# Patient Record
Sex: Male | Born: 1995
Health system: Southern US, Community
[De-identification: ages and names within clinical notes are randomized; demographics above are authoritative.]

## PROBLEM LIST (undated history)

## (undated) DIAGNOSIS — Z8781 Personal history of (healed) traumatic fracture: Secondary | ICD-10-CM

---
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
## 2018-01-22 DIAGNOSIS — Z23 Encounter for immunization: Secondary | ICD-10-CM | POA: Diagnosis not present

## 2018-06-22 ENCOUNTER — Ambulatory Visit: Payer: 59 | Admitting: Family Medicine

## 2018-06-22 ENCOUNTER — Encounter: Payer: Self-pay | Admitting: Family Medicine

## 2018-06-22 VITALS — BP 108/76 | HR 76 | Temp 97.7°F | Ht 68.0 in | Wt 139.0 lb

## 2018-06-22 DIAGNOSIS — Z0000 Encounter for general adult medical examination without abnormal findings: Secondary | ICD-10-CM

## 2018-06-22 DIAGNOSIS — Z0001 Encounter for general adult medical examination with abnormal findings: Secondary | ICD-10-CM

## 2018-06-22 DIAGNOSIS — H6502 Acute serous otitis media, left ear: Secondary | ICD-10-CM | POA: Diagnosis not present

## 2018-06-22 MED ORDER — AMOXICILLIN 500 MG PO CAPS: 500.0000 mg | ORAL_CAPSULE | Freq: Two times a day (BID) | ORAL | 0 refills | Status: AC

## 2018-06-22 NOTE — Progress Notes (Signed)
Subjective:     Antonio Roberts is a 22 y.o. male and is here for a comprehensive physical exam. In the past was seen by Dr. Yehuda Budd.  The patient reports problems - L ear feels "stopped up".  Pt notes he feels like his ear never "popped" since returning from a flight to the [place] at the end of June. Pt did have cold symptoms for a few days after his trip.  Pt denies fever, chills, ear or facial pain or pressure.   Pt is working at [employer].  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  Pt denies tobacco, EtOH, or drug use.  Pt occasionally vapes.  Allergies: NKDA  Pt requesting any immunizations he needs for school.   Social History   Socioeconomic History  . Marital status: Single    Spouse name: Not on file  . Number of children: Not on file  . Years of education: Not on file  . Highest education level: Not on file  Occupational History  . Not on file  Social Needs  . Financial resource strain: Not on file  . Food insecurity:    Worry: Not on file    Inability: Not on file  . Transportation needs:    Medical: Not on file    Non-medical: Not on file  Tobacco Use  . Smoking status: Never Smoker  . Smokeless tobacco: Never Used  Substance and Sexual Activity  . Alcohol use: Yes    Comment: occassional  . Drug use: Never  . Sexual activity: Not on file  Lifestyle  . Physical activity:    Days per week: Not on file    Minutes per session: Not on file  . Stress: Not on file  Relationships  . Social connections:    Talks on phone: Not on file    Gets together: Not on file    Attends religious service: Not on file    Active member of club or organization: Not on file    Attends meetings of clubs or organizations: Not on file    Relationship status: Not on file  . Intimate partner violence:    Fear of current or ex partner: Not on file    Emotionally abused: Not on file    Physically abused: Not on file     Forced sexual activity: Not on file  Other Topics Concern  . Not on file  Social History Narrative  . Not on file   Health Maintenance  Topic Date Due  . HIV Screening  08/24/2013  . TETANUS/TDAP  08/24/2017  . INFLUENZA VACCINE  07/16/2018    The following portions of the patient's history were reviewed and updated as appropriate: allergies, current medications, past family history, past medical history, past social history, past surgical history and problem list.  Review of Systems A comprehensive review of systems was negative.   Objective:    BP 108/76 (BP Location: Left Arm, Patient Position: Sitting, Cuff Size: Normal)   Pulse 76   Temp 97.7 F (36.5 C) (Oral)   Ht 5\' 8"  (1.727 m)   Wt 139 lb (63 kg)   SpO2 98%   BMI 21.13 kg/m  General appearance: alert, cooperative and no distress Head: Normocephalic, without obvious abnormality, atraumatic Eyes: conjunctivae/corneas clear. PERRL, EOM's intact. Fundi benign. Ears: normal R TM and external ear canal.  L external ear normal.  L TM erythematous, full, small area of purulence. Nose:  Nares normal. Septum midline. Mucosa normal. No drainage or sinus tenderness. Throat: lips, mucosa, and tongue normal; teeth and gums normal Neck: no adenopathy, no carotid bruit, no JVD, supple, symmetrical, trachea midline and thyroid not enlarged, symmetric, no tenderness/mass/nodules Lungs: clear to auscultation bilaterally Heart: regular rate and rhythm, S1, S2 normal, no murmur, click, rub or gallop Abdomen: soft, non-tender; bowel sounds normal; no masses,  no organomegaly Extremities: extremities normal, atraumatic, no cyanosis or edema Skin: Skin color, texture, turgor normal. No rashes or lesions Neurologic: Alert and oriented X 3, normal strength and tone. Normal symmetric reflexes. Normal coordination and gait    Assessment:    Healthy male exam with AOM of L ear.     Plan:     Anticipatory guidance given including  wearing seatbelts, smoke detectors in the home, increasing physical activity, increasing p.o. intake of water and vegetables. -next CPE in 1 yr -will check [facility] to see if pt needs any vaccines for school.    No vaccine hx in [facility].  Pt is unsure about immunizations.  Will have him check with his parents for immunization record.  Pt can schedule a nurse visit to review immunization record and provide any missing vaccines. See After Visit Summary for Counseling Recommendations    AOM, L -amoxicillin 500 mg BID x 7 days -given handout  F/u prn  Abbe AmsterdamShannon Cadel Stairs, MD

## 2018-06-22 NOTE — Patient Instructions (Signed)
Preventive Care 18-39 Years, Male Preventive care refers to lifestyle choices and visits with your health care provider that can promote health and wellness. What does preventive care include?  A yearly physical exam. This is also called an annual well check.  Dental exams once or twice a year.  Routine eye exams. Ask your health care provider how often you should have your eyes checked.  Personal lifestyle choices, including: ? Daily care of your teeth and gums. ? Regular physical activity. ? Eating a healthy diet. ? Avoiding tobacco and drug use. ? Limiting alcohol use. ? Practicing safe sex. What happens during an annual well check? The services and screenings done by your health care provider during your annual well check will depend on your age, overall health, lifestyle risk factors, and family history of disease. Counseling Your health care provider may ask you questions about your:  Alcohol use.  Tobacco use.  Drug use.  Emotional well-being.  Home and relationship well-being.  Sexual activity.  Eating habits.  Work and work Statistician.  Screening You may have the following tests or measurements:  Height, weight, and BMI.  Blood pressure.  Lipid and cholesterol levels. These may be checked every 5 years starting at age 29.  Diabetes screening. This is done by checking your blood sugar (glucose) after you have not eaten for a while (fasting).  Skin check.  Hepatitis C blood test.  Hepatitis B blood test.  Sexually transmitted disease (STD) testing.  Discuss your test results, treatment options, and if necessary, the need for more tests with your health care provider. Vaccines Your health care provider may recommend certain vaccines, such as:  Influenza vaccine. This is recommended every year.  Tetanus, diphtheria, and acellular pertussis (Tdap, Td) vaccine. You may need a Td booster every 10 years.  Varicella vaccine. You may need this if you  have not been vaccinated.  HPV vaccine. If you are 18 or younger, you may need three doses over 6 months.  Measles, mumps, and rubella (MMR) vaccine. You may need at least one dose of MMR.You may also need a second dose.  Pneumococcal 13-valent conjugate (PCV13) vaccine. You may need this if you have certain conditions and have not been vaccinated.  Pneumococcal polysaccharide (PPSV23) vaccine. You may need one or two doses if you smoke cigarettes or if you have certain conditions.  Meningococcal vaccine. One dose is recommended if you are age 55-21 years and a first-year college student living in a residence hall, or if you have one of several medical conditions. You may also need additional booster doses.  Hepatitis A vaccine. You may need this if you have certain conditions or if you travel or work in places where you may be exposed to hepatitis A.  Hepatitis B vaccine. You may need this if you have certain conditions or if you travel or work in places where you may be exposed to hepatitis B.  Haemophilus influenzae type b (Hib) vaccine. You may need this if you have certain risk factors.  Talk to your health care provider about which screenings and vaccines you need and how often you need them. This information is not intended to replace advice given to you by your health care provider. Make sure you discuss any questions you have with your health care provider. Document Released: 01/28/2002 Document Revised: 08/21/2016 Document Reviewed: 10/03/2015 Elsevier Interactive Patient Education  2018 [employer].  Otitis Media, Adult Otitis media occurs when there is inflammation and fluid in the  middle ear. Your middle ear is a part of the ear that contains bones for hearing as well as air that helps send sounds to your brain. What are the causes? This condition is caused by a blockage in the eustachian tube. This tube drains fluid from the ear to the back of the nose (nasopharynx). A  blockage in this tube can be caused by an object or by swelling (edema) in the tube. Problems that can cause a blockage include:  A cold or other upper respiratory infection.  Allergies.  An irritant, such as tobacco smoke.  Enlarged adenoids. The adenoids are areas of soft tissue located high in the back of the throat, behind the nose and the roof of the mouth.  A mass in the nasopharynx.  Damage to the ear caused by pressure changes (barotrauma).  What are the signs or symptoms? Symptoms of this condition include:  Ear pain.  A fever.  Decreased hearing.  A headache.  Tiredness (lethargy).  Fluid leaking from the ear.  Ringing in the ear.  How is this diagnosed? This condition is diagnosed with a physical exam. During the exam your health care provider will use an instrument called an otoscope to look into your ear and check for redness, swelling, and fluid. He or she will also ask about your symptoms. Your health care provider may also order tests, such as:  A test to check the movement of the eardrum (pneumatic otoscopy). This test is done by squeezing a small amount of air into the ear.  A test that changes air pressure in the middle ear to check how well the eardrum moves and whether the eustachian tube is working (tympanogram).  How is this treated? This condition usually goes away on its own within 3-5 days. But if the condition is caused by a bacteria infection and does not go away own its own, or keeps coming back, your health care provider may:  Prescribe antibiotic medicines to treat the infection.  Prescribe or recommend medicines to control pain.  Follow these instructions at home:  Take over-the-counter and prescription medicines only as told by your health care provider.  If you were prescribed an antibiotic medicine, take it as told by your health care provider. Do not stop taking the antibiotic even if you start to feel better.  Keep all follow-up  visits as told by your health care provider. This is important. Contact a health care provider if:  You have bleeding from your nose.  There is a lump on your neck.  You are not getting better in 5 days.  You feel worse instead of better. Get help right away if:  You have severe pain that is not controlled with medicine.  You have swelling, redness, or pain around your ear.  You have stiffness in your neck.  A part of your face is paralyzed.  The bone behind your ear (mastoid) is tender when you touch it.  You develop a severe headache. Summary  Otitis media is redness, soreness, and swelling of the middle ear.  This condition usually goes away on its own within 3-5 days.  If the problem does not go away in 3-5 days, your health care provider may prescribe or recommend medicines to treat your symptoms.  If you were prescribed an antibiotic medicine, take it as told by your health care provider. This information is not intended to replace advice given to you by your health care provider. Make sure you discuss any questions  you have with your health care provider. Document Released: 09/06/2004 Document Revised: 11/22/2016 Document Reviewed: 11/22/2016 Elsevier Interactive Patient Education  [employer].

## 2018-06-26 ENCOUNTER — Ambulatory Visit: Payer: 59

## 2018-06-26 ENCOUNTER — Ambulatory Visit (INDEPENDENT_AMBULATORY_CARE_PROVIDER_SITE_OTHER): Payer: 59

## 2018-06-26 DIAGNOSIS — Z23 Encounter for immunization: Secondary | ICD-10-CM

## 2018-09-22 DIAGNOSIS — Z23 Encounter for immunization: Secondary | ICD-10-CM | POA: Diagnosis not present

## 2022-07-02 DIAGNOSIS — J0100 Acute maxillary sinusitis, unspecified: Secondary | ICD-10-CM | POA: Diagnosis not present

## 2022-07-02 DIAGNOSIS — H6121 Impacted cerumen, right ear: Secondary | ICD-10-CM | POA: Diagnosis not present

## 2022-07-02 DIAGNOSIS — H66001 Acute suppurative otitis media without spontaneous rupture of ear drum, right ear: Secondary | ICD-10-CM | POA: Diagnosis not present

## 2022-07-02 DIAGNOSIS — Z6824 Body mass index (BMI) 24.0-24.9, adult: Secondary | ICD-10-CM | POA: Diagnosis not present

## 2022-09-02 DIAGNOSIS — J029 Acute pharyngitis, unspecified: Secondary | ICD-10-CM | POA: Diagnosis not present

## 2022-09-02 DIAGNOSIS — Z6824 Body mass index (BMI) 24.0-24.9, adult: Secondary | ICD-10-CM | POA: Diagnosis not present

## 2022-09-02 DIAGNOSIS — H6983 Other specified disorders of Eustachian tube, bilateral: Secondary | ICD-10-CM | POA: Diagnosis not present

## 2023-01-18 DIAGNOSIS — F419 Anxiety disorder, unspecified: Secondary | ICD-10-CM | POA: Diagnosis not present

## 2023-01-24 DIAGNOSIS — F419 Anxiety disorder, unspecified: Secondary | ICD-10-CM | POA: Diagnosis not present

## 2023-02-04 DIAGNOSIS — F331 Major depressive disorder, recurrent, moderate: Secondary | ICD-10-CM | POA: Diagnosis not present

## 2023-02-04 DIAGNOSIS — F988 Other specified behavioral and emotional disorders with onset usually occurring in childhood and adolescence: Secondary | ICD-10-CM | POA: Diagnosis not present

## 2023-02-04 DIAGNOSIS — F411 Generalized anxiety disorder: Secondary | ICD-10-CM | POA: Diagnosis not present

## 2023-02-04 DIAGNOSIS — G4700 Insomnia, unspecified: Secondary | ICD-10-CM | POA: Diagnosis not present

## 2023-02-04 DIAGNOSIS — R5383 Other fatigue: Secondary | ICD-10-CM | POA: Diagnosis not present

## 2023-02-21 DIAGNOSIS — F419 Anxiety disorder, unspecified: Secondary | ICD-10-CM | POA: Diagnosis not present

## 2023-03-12 DIAGNOSIS — F331 Major depressive disorder, recurrent, moderate: Secondary | ICD-10-CM | POA: Diagnosis not present

## 2023-03-12 DIAGNOSIS — F411 Generalized anxiety disorder: Secondary | ICD-10-CM | POA: Diagnosis not present

## 2023-03-12 DIAGNOSIS — G4700 Insomnia, unspecified: Secondary | ICD-10-CM | POA: Diagnosis not present

## 2023-03-14 DIAGNOSIS — F419 Anxiety disorder, unspecified: Secondary | ICD-10-CM | POA: Diagnosis not present

## 2023-04-16 DIAGNOSIS — F419 Anxiety disorder, unspecified: Secondary | ICD-10-CM | POA: Diagnosis not present

## 2023-06-06 DIAGNOSIS — F419 Anxiety disorder, unspecified: Secondary | ICD-10-CM | POA: Diagnosis not present

## 2023-08-13 DIAGNOSIS — F411 Generalized anxiety disorder: Secondary | ICD-10-CM | POA: Diagnosis not present

## 2023-08-13 DIAGNOSIS — G4700 Insomnia, unspecified: Secondary | ICD-10-CM | POA: Diagnosis not present

## 2023-08-13 DIAGNOSIS — F331 Major depressive disorder, recurrent, moderate: Secondary | ICD-10-CM | POA: Diagnosis not present

## 2023-08-27 ENCOUNTER — Encounter: Payer: Self-pay | Admitting: Family Medicine

## 2023-08-27 ENCOUNTER — Ambulatory Visit (INDEPENDENT_AMBULATORY_CARE_PROVIDER_SITE_OTHER): Payer: BC Managed Care – PPO | Admitting: Family Medicine

## 2023-08-27 VITALS — BP 120/90 | HR 88 | Ht 68.0 in | Wt 166.0 lb

## 2023-08-27 DIAGNOSIS — M9901 Segmental and somatic dysfunction of cervical region: Secondary | ICD-10-CM

## 2023-08-27 DIAGNOSIS — M542 Cervicalgia: Secondary | ICD-10-CM

## 2023-08-27 DIAGNOSIS — M9902 Segmental and somatic dysfunction of thoracic region: Secondary | ICD-10-CM

## 2023-08-27 DIAGNOSIS — M546 Pain in thoracic spine: Secondary | ICD-10-CM | POA: Diagnosis not present

## 2023-08-27 DIAGNOSIS — M9908 Segmental and somatic dysfunction of rib cage: Secondary | ICD-10-CM | POA: Diagnosis not present

## 2023-08-27 DIAGNOSIS — G2589 Other specified extrapyramidal and movement disorders: Secondary | ICD-10-CM

## 2023-08-27 NOTE — Assessment & Plan Note (Signed)
   Decision today to treat with OMT was based on Physical Exam  After verbal consent patient was treated with HVLA, ME, FPR techniques in cervical, thoracic, rib,areas, all areas are chronic   Patient tolerated the procedure well with improvement in symptoms  Patient given exercises, stretches and lifestyle modifications  See medications in patient instructions if given  Patient will follow up in 4-8 weeks 

## 2023-08-27 NOTE — Assessment & Plan Note (Signed)
Family patient has more of a scapular dyskinesis secondary to posture and ergonomics.  Discussed which activities to do and which ones to avoid.  Discussed home exercises and icing regimen.  Discussed increasing activity otherwise.  Follow-up again in 6 to 8 weeks.

## 2023-08-27 NOTE — Progress Notes (Signed)
  [facility] [address] [place] [address] Phone: [phone] Subjective:   Antonio Roberts, am serving as a scribe for Dr. Antoine Primas.  I'm seeing this patient by the request  of:  Lewis Moccasin, MD  CC: Upper Back pain  [id number]  Antonio Roberts is a 27 y.o. male coming in with complaint of upper back pain. Patient states that he woke up last week and he had a back spasm in thoracic spine. Pain has improved but is still achy in the scapular area. Area can be numb and tingling at times. Pain with skull crushers. Denies any radiaing symptoms.       Past Medical History:  Diagnosis Date   Hx of fracture of arm    No past surgical history on file. Social History   Socioeconomic History   Marital status: Single    Spouse name: Not on file   Number of children: Not on file   Years of education: Not on file   Highest education level: Not on file  Occupational History   Not on file  Tobacco Use   Smoking status: Never   Smokeless tobacco: Never  Substance and Sexual Activity   Alcohol use: Yes    Comment: occassional   Drug use: Never   Sexual activity: Not on file  Other Topics Concern   Not on file  Social History Narrative   Not on file   Social Determinants of Health   Financial Resource Strain: Not on file  Food Insecurity: Not on file  Transportation Needs: Not on file  Physical Activity: Not on file  Stress: Not on file  Social Connections: Not on file   No Known Allergies Family History  Problem Relation Age of Onset   Hyperlipidemia Father    Hypertension Father    Depression Brother          Current Outpatient Medications (Other):    buPROPion (WELLBUTRIN XL) 150 MG 24 hr tablet, Take 150 mg by mouth daily.   Reviewed prior external information including notes and imaging from  primary care provider As well as notes that were available from care everywhere and other healthcare  systems.  Past medical history, social, surgical and family history all reviewed in electronic medical record.  No pertanent information unless stated regarding to the chief complaint.   Review of Systems:  No headache, visual changes, nausea, vomiting, diarrhea, constipation, dizziness, abdominal pain, skin rash, fevers, chills, night sweats, weight loss, swollen lymph nodes, body aches, joint swelling, chest pain, shortness of breath, mood changes. POSITIVE muscle aches  Objective  Blood pressure (!) 120/90, pulse 88, height 5\' 8"  (1.727 m), weight 166 lb (75.3 kg), SpO2 99%.   General: No apparent distress alert and oriented x3 mood and affect normal, dressed appropriately.  HEENT: Pupils equal, extraocular movements intact  Respiratory: Patient's speak in full sentences and does not appear short of breath  Cardiovascular: No lower extremity edema, non tender, no erythema  Patient does have scapular dyskinesis noted on the right side.  The patient does have some inferior winging noted.  Patient does have some tightness noted in the paraspinal musculature otherwise.  Osteopathic findings C2 flexed rotated and side bent right T7 extended rotated and side bent right inhaled rib  Impression and Recommendations:    The above documentation has been reviewed and is accurate and complete Judi Saa, DO

## 2023-08-27 NOTE — Patient Instructions (Signed)
Do prescribed exercises at least 3x a week  Adjustable standing desk Consider a vertical mouse Stay active See you again in 6-8 weeks

## 2023-09-04 ENCOUNTER — Ambulatory Visit: Payer: Self-pay | Admitting: Family Medicine

## 2023-09-26 DIAGNOSIS — F419 Anxiety disorder, unspecified: Secondary | ICD-10-CM | POA: Diagnosis not present

## 2023-10-08 ENCOUNTER — Ambulatory Visit: Payer: BC Managed Care – PPO | Admitting: Family Medicine

## 2023-10-12 ENCOUNTER — Other Ambulatory Visit: Payer: Self-pay

## 2023-10-12 ENCOUNTER — Encounter (HOSPITAL_COMMUNITY): Payer: Self-pay

## 2023-10-12 ENCOUNTER — Ambulatory Visit (HOSPITAL_COMMUNITY)
Admission: RE | Admit: 2023-10-12 | Discharge: 2023-10-12 | Disposition: A | Discharge status: Home / Self Care | Payer: BC Managed Care – PPO | Source: Ambulatory Visit | Attending: Family Medicine | Admitting: Family Medicine

## 2023-10-12 VITALS — BP 132/75 | HR 92 | Temp 97.9°F | Resp 20

## 2023-10-12 DIAGNOSIS — K4030 Unilateral inguinal hernia, with obstruction, without gangrene, not specified as recurrent: Secondary | ICD-10-CM

## 2023-10-12 NOTE — ED Provider Notes (Signed)
[facility]    CSN: [id number] Arrival date & time: 10/12/23  1440      History   Chief Complaint Chief Complaint  Patient presents with   Testicle Pain    Swollen lymph node in pelvic/groin region. Not exactly testicle pain but I don't know what else to put it as - Entered by patient   Groin Pain    HPI Antonio Roberts is a 27 y.o. male.   Patient presents to clinic for complaints of left-sided groin swelling that started on Thursday.  The night before he had went out to eat and had gastrointestinal upset, reports multiple bowel movements.  He woke up in the morning with left-sided inguinal swelling.  This is never happened before.  He denies any fevers, no nausea or vomiting.  Reports the pain was worse on Thursday and has had improved over the past few days but the bulge persists.  Initially he was able to push the bulge back in, cannot push it back and now.  Reports the area is nontender to palpation.  Denies dysuria.  No ingrown hairs, drainage or concern for abscess.  The history is provided by the patient and medical records.  Testicle Pain  Groin Pain    Past Medical History:  Diagnosis Date   Hx of fracture of arm     Patient Active Problem List   Diagnosis Date Noted   Scapular dyskinesis 08/27/2023   Somatic dysfunction of spine, thoracic 08/27/2023    History reviewed. No pertinent surgical history.     Home Medications    Prior to Admission medications   Medication Sig Start Date End Date Taking? Authorizing Provider  buPROPion (WELLBUTRIN XL) 150 MG 24 hr tablet Take 150 mg by mouth daily.   Yes [provider]    Family History Family History  Problem Relation Age of Onset   Hyperlipidemia Father    Hypertension Father    Depression Brother     Social History Social History   Tobacco Use   Smoking status: Never   Smokeless tobacco: Never  Substance Use Topics   Alcohol use: Yes    Comment: occassional    Drug use: Never     Allergies   Patient has no known allergies.   Review of Systems Review of Systems  Per HPI   Physical Exam Triage Vital Signs ED Triage Vitals  Encounter Vitals Group     BP 10/12/23 1459 132/75     Systolic BP Percentile --      Diastolic BP Percentile --      Pulse Rate 10/12/23 1459 92     Resp 10/12/23 1459 20     Temp 10/12/23 1459 97.9 F (36.6 C)     Temp src --      SpO2 10/12/23 1459 97 %     Weight --      Height --      Head Circumference --      Peak Flow --      Pain Score 10/12/23 1458 6     Pain Loc --      Pain Education --      Exclude from Growth Chart --    No data found.  Updated Vital Signs BP 132/75   Pulse 92   Temp 97.9 F (36.6 C)   Resp 20   SpO2 97%   Visual Acuity Right Eye Distance:   Left Eye Distance:   Bilateral Distance:  Right Eye Near:   Left Eye Near:    Bilateral Near:     Physical Exam Vitals and nursing note reviewed. Exam conducted with a chaperone present.  Constitutional:      Appearance: Normal appearance.  HENT:     Head: Normocephalic and atraumatic.     Right Ear: External ear normal.     Left Ear: External ear normal.     Nose: Nose normal.  Cardiovascular:     Rate and Rhythm: Normal rate.  Pulmonary:     Effort: Pulmonary effort is normal.  Abdominal:     Hernia: A hernia is present. Hernia is present in the left inguinal area.  Genitourinary:      Comments: Large irreducible hernia to the left inguinal canal.  Area is nontender to palpation. Musculoskeletal:        General: Normal range of motion.     Cervical back: Normal range of motion.  Skin:    General: Skin is warm and dry.  Neurological:     General: No focal deficit present.     Mental Status: He is alert and oriented to person, place, and time.  Psychiatric:        Mood and Affect: Mood normal.        Behavior: Behavior normal. Behavior is cooperative.      UC Treatments / Results  Labs (all labs  ordered are listed, but only abnormal results are displayed) Labs Reviewed - No data to display  EKG   Radiology No results found.  Procedures Procedures (including critical care time)  Medications Ordered in UC Medications - No data to display  Initial Impression / Assessment and Plan / UC Course  I have reviewed the triage vital signs and the nursing notes.  Pertinent labs & imaging results that were available during my care of the patient were reviewed by me and considered in my medical decision making (see chart for details).  Vitals and triage reviewed, patient is hemodynamically stable.  Chaperone present on physical exam, appears to have a large reducible inguinal canal hernia left side.  Does not have any current signs or symptoms of strangulation, no pain, fever, nausea or vomiting.  Advised strict follow-up with [facility] in the emergency department if symptoms progress.  Plan of care, follow-up care and return precautions given, no questions at this time.     Final Clinical Impressions(s) / UC Diagnoses   Final diagnoses:  Inguinal hernia with irreducibility     Discharge Instructions      You appear to have a left-sided inguinal hernia.  Please do not strain with bowel movements or pick up anything heavy at this time.  You can ice the area and take ibuprofen as needed for pain, 800 mg every 8 hours with food.  Please call [facility] tomorrow for further evaluation.  In the meantime do not hesitate to seek immediate care at the nearest emergency department if you develop nausea, vomiting, extreme pain, or any worsening symptoms.      ED Prescriptions   None    PDMP not reviewed this encounter.   Kayra Crowell, [place], [place] 10/12/23 [phone]

## 2023-10-12 NOTE — Discharge Instructions (Signed)
You appear to have a left-sided inguinal hernia.  Please do not strain with bowel movements or pick up anything heavy at this time.  You can ice the area and take ibuprofen as needed for pain, 800 mg every 8 hours with food.  Please call [facility] tomorrow for further evaluation.  In the meantime do not hesitate to seek immediate care at the nearest emergency department if you develop nausea, vomiting, extreme pain, or any worsening symptoms.

## 2023-10-12 NOTE — ED Triage Notes (Signed)
Pt reports an enlarged lymph node in Lt groin since Thursday.

## 2023-10-13 ENCOUNTER — Observation Stay (HOSPITAL_COMMUNITY)
Admission: EM | Admit: 2023-10-13 | Discharge: 2023-10-14 | Disposition: A | Discharge status: Home / Self Care | Payer: Worker's Compensation | Attending: Surgery | Admitting: Surgery

## 2023-10-13 ENCOUNTER — Emergency Department (HOSPITAL_COMMUNITY): Payer: Worker's Compensation

## 2023-10-13 ENCOUNTER — Encounter (HOSPITAL_COMMUNITY): Payer: Self-pay | Admitting: Emergency Medicine

## 2023-10-13 DIAGNOSIS — K4090 Unilateral inguinal hernia, without obstruction or gangrene, not specified as recurrent: Principal | ICD-10-CM

## 2023-10-13 DIAGNOSIS — K420 Umbilical hernia with obstruction, without gangrene: Secondary | ICD-10-CM | POA: Insufficient documentation

## 2023-10-13 DIAGNOSIS — K429 Umbilical hernia without obstruction or gangrene: Secondary | ICD-10-CM | POA: Diagnosis not present

## 2023-10-13 DIAGNOSIS — K4030 Unilateral inguinal hernia, with obstruction, without gangrene, not specified as recurrent: Secondary | ICD-10-CM | POA: Diagnosis not present

## 2023-10-13 DIAGNOSIS — R1032 Left lower quadrant pain: Secondary | ICD-10-CM | POA: Diagnosis not present

## 2023-10-13 LAB — CBC WITH DIFFERENTIAL/PLATELET
Abs Immature Granulocytes: 0.01 10*3/uL (ref 0.00–0.07)
Basophils Absolute: 0 10*3/uL (ref 0.0–0.1)
Basophils Relative: 1 %
Eosinophils Absolute: 0.1 10*3/uL (ref 0.0–0.5)
Eosinophils Relative: 1 %
HCT: 44.6 % (ref 39.0–52.0)
Hemoglobin: 15.2 g/dL (ref 13.0–17.0)
Immature Granulocytes: 0 %
Lymphocytes Relative: 24 %
Lymphs Abs: 2 10*3/uL (ref 0.7–4.0)
MCH: 29.5 pg (ref 26.0–34.0)
MCHC: 34.1 g/dL (ref 30.0–36.0)
MCV: 86.6 fL (ref 80.0–100.0)
Monocytes Absolute: 0.6 10*3/uL (ref 0.1–1.0)
Monocytes Relative: 7 %
Neutro Abs: 5.7 10*3/uL (ref 1.7–7.7)
Neutrophils Relative %: 67 %
Platelets: 298 10*3/uL (ref 150–400)
RBC: 5.15 MIL/uL (ref 4.22–5.81)
RDW: 12.2 % (ref 11.5–15.5)
WBC: 8.4 10*3/uL (ref 4.0–10.5)
nRBC: 0 % (ref 0.0–0.2)

## 2023-10-13 LAB — URINALYSIS, ROUTINE W REFLEX MICROSCOPIC
Bilirubin Urine: NEGATIVE
Glucose, UA: NEGATIVE mg/dL
Hgb urine dipstick: NEGATIVE
Ketones, ur: NEGATIVE mg/dL
Leukocytes,Ua: NEGATIVE
Nitrite: NEGATIVE
Protein, ur: NEGATIVE mg/dL
Specific Gravity, Urine: 1.01 (ref 1.005–1.030)
pH: 7 (ref 5.0–8.0)

## 2023-10-13 LAB — LIPASE, BLOOD: Lipase: 28 U/L (ref 11–51)

## 2023-10-13 LAB — COMPREHENSIVE METABOLIC PANEL
ALT: 17 U/L (ref 0–44)
AST: 18 U/L (ref 15–41)
Albumin: 4.4 g/dL (ref 3.5–5.0)
Alkaline Phosphatase: 53 U/L (ref 38–126)
Anion gap: 9 (ref 5–15)
BUN: 9 mg/dL (ref 6–20)
CO2: 26 mmol/L (ref 22–32)
Calcium: 9.8 mg/dL (ref 8.9–10.3)
Chloride: 104 mmol/L (ref 98–111)
Creatinine, Ser: 0.89 mg/dL (ref 0.61–1.24)
GFR, Estimated: >60 mL/min (ref >60)
Glucose, Bld: 101 mg/dL — ABNORMAL HIGH (ref 70–99)
Potassium: 4 mmol/L (ref 3.5–5.1)
Sodium: 139 mmol/L (ref 135–145)
Total Bilirubin: 0.4 mg/dL (ref 0.3–1.2)
Total Protein: 7.3 g/dL (ref 6.5–8.1)

## 2023-10-13 LAB — I-STAT CG4 LACTIC ACID, ED: Lactic Acid, Venous: 0.9 mmol/L (ref 0.5–1.9)

## 2023-10-13 MED ORDER — ONDANSETRON HCL 4 MG/2ML IJ SOLN
4.0000 mg | Freq: Once | INTRAMUSCULAR | Status: AC
  Administered 2023-10-13: 4 mg via INTRAVENOUS
  Filled 2023-10-13: qty 2

## 2023-10-13 MED ORDER — IBUPROFEN 600 MG PO TABS: 600.0000 mg | ORAL_TABLET | Freq: Four times a day (QID) | ORAL | Status: DC | PRN

## 2023-10-13 MED ORDER — IOHEXOL 350 MG/ML SOLN
75.0000 mL | Freq: Once | INTRAVENOUS | Status: AC | PRN
  Administered 2023-10-13: 75 mL via INTRAVENOUS

## 2023-10-13 MED ORDER — ACETAMINOPHEN 500 MG PO TABS
1000.0000 mg | ORAL_TABLET | Freq: Four times a day (QID) | ORAL | Status: DC
  Administered 2023-10-13 – 2023-10-14 (×2): 1000 mg via ORAL
  Filled 2023-10-13 (×2): qty 2

## 2023-10-13 MED ORDER — KCL IN DEXTROSE-NACL 20-5-0.45 MEQ/L-%-% IV SOLN
INTRAVENOUS | Status: DC
  Filled 2023-10-13: qty 1000

## 2023-10-13 MED ORDER — HYDROMORPHONE HCL 1 MG/ML IJ SOLN
1.0000 mg | INTRAMUSCULAR | Status: DC | PRN
  Administered 2023-10-13: 1 mg via INTRAVENOUS
  Filled 2023-10-13: qty 1

## 2023-10-13 MED ORDER — MORPHINE SULFATE (PF) 4 MG/ML IV SOLN
4.0000 mg | Freq: Once | INTRAVENOUS | Status: AC
  Administered 2023-10-13: 4 mg via INTRAVENOUS
  Filled 2023-10-13: qty 1

## 2023-10-13 NOTE — ED Provider Triage Note (Signed)
Emergency Medicine Provider Triage Evaluation Note  Antonio Roberts , a 27 y.o. male  was evaluated in triage.  Pt complains of left groin pain.  Was seen in urgent care yesterday and told he has an inguinal hernia.  Was told to present to the emergency department if his pain got worse.  States the pain has gotten continuously worse.  It occasionally radiates to the left testicle.  No urinary complaints.  Review of Systems  Positive: As above Negative: As above  Physical Exam  BP 122/83 (BP Location: Right Arm)   Pulse 87   Temp 98.1 F (36.7 C)   Resp 17   SpO2 99%  Gen:   Awake, no distress   Resp:  Normal effort  MSK:   Moves extremities without difficulty  Other:  GU exam deferred  Medical Decision Making  Medically screening exam initiated at 5:36 PM.  Appropriate orders placed.  Antonio Roberts was informed that the remainder of the evaluation will be completed by another provider, this initial triage assessment does not replace that evaluation, and the importance of remaining in the ED until their evaluation is complete.  Workup initiated.  CT as there is concern for strangulated/incarcerated hernia   Antonio Piper, PA-C 10/13/23 1737

## 2023-10-13 NOTE — ED Triage Notes (Signed)
Pt sent down to the ED to have a possible inguinal hernia reduced, pt noticed it this past Thursday , does do some heavy lifting  at work

## 2023-10-13 NOTE — ED Provider Notes (Signed)
  [place] EMERGENCY DEPARTMENT AT [facility] Provider Note   CSN: [id number] Arrival date & time: 10/13/23  1548     History {Add pertinent medical, surgical, social history, OB history to HPI:1} No chief complaint on file.   Antonio Roberts is a 27 y.o. male.  HPI     Thursday  Home Medications Prior to Admission medications   Medication Sig Start Date End Date Taking? Authorizing Provider  buPROPion (WELLBUTRIN XL) 150 MG 24 hr tablet Take 150 mg by mouth daily.    [provider]      Allergies    Patient has no known allergies.    Review of Systems   Review of Systems  Physical Exam Updated Vital Signs BP 121/78 (BP Location: Left Arm)   Pulse 78   Temp 98.1 F (36.7 C) (Oral)   Resp 18   SpO2 100%  Physical Exam  ED Results / Procedures / Treatments   Labs (all labs ordered are listed, but only abnormal results are displayed) Labs Reviewed  COMPREHENSIVE METABOLIC PANEL - Abnormal; Notable for the following components:      Result Value   Glucose, Bld 101 (*)    All other components within normal limits  URINALYSIS, ROUTINE W REFLEX MICROSCOPIC - Abnormal; Notable for the following components:   Color, Urine STRAW (*)    All other components within normal limits  CBC WITH DIFFERENTIAL/PLATELET  LIPASE, BLOOD    EKG None  Radiology No results found.  Procedures Procedures  {Document cardiac monitor, telemetry assessment procedure when appropriate:1}  Medications Ordered in ED Medications  iohexol (OMNIPAQUE) 350 MG/ML injection 75 mL (has no administration in time range)  morphine (PF) 4 MG/ML injection 4 mg (4 mg Intravenous Given 10/13/23 2113)  ondansetron (ZOFRAN) injection 4 mg (4 mg Intravenous Given 10/13/23 2112)    ED Course/ Medical Decision Making/ A&P   {   Click here for ABCD2, HEART and other calculatorsREFRESH Note before signing :1}                              Medical Decision  Making Risk Prescription drug management.   ***  {Document critical care time when appropriate:1} {Document review of labs and clinical decision tools ie heart score, Chads2Vasc2 etc:1}  {Document your independent review of radiology images, and any outside records:1} {Document your discussion with family members, caretakers, and with consultants:1} {Document social determinants of health affecting pt's care:1} {Document your decision making why or why not admission, treatments were needed:1} Final Clinical Impression(s) / ED Diagnoses Final diagnoses:  None    Rx / DC Orders ED Discharge Orders     None

## 2023-10-13 NOTE — H&P (Signed)
Antonio Roberts [birth year]  [id number].    HPI:  27 y/o M with no significant PMH who presents with a left groin bulge w/ CT showing a fat-containing left inguinal hernia.  He reports that he was lifting something on Thursday when he felt a "pop" in his groin.  Since then the bulge has become increasingly larger with some associated pain. He denies obstructive symptoms.  He is AF and HDS.  WBC 8.    ROS: Review of Systems  Constitutional: Negative.   HENT: Negative.    Eyes: Negative.   Respiratory: Negative.    Cardiovascular: Negative.   Gastrointestinal: Negative.   Genitourinary: Negative.   Musculoskeletal: Negative.   Skin: Negative.   Neurological: Negative.   Endo/Heme/Allergies: Negative.   Psychiatric/Behavioral: Negative.      Family History  Problem Relation Age of Onset   Hyperlipidemia Father    Hypertension Father    Depression Brother     Past Medical History:  Diagnosis Date   Hx of fracture of arm     History reviewed. No pertinent surgical history.  Social History:  reports that he has never smoked. He has never used smokeless tobacco. He reports current alcohol use. He reports that he does not use drugs.  Allergies: No Known Allergies  (Not in a hospital admission)   Physical Exam: Blood pressure 124/80, pulse 78, temperature 98.1 F (36.7 C), temperature source Oral, resp. rate 18, SpO2 100%. Gen: male resting in bed, NAD Groin: normal anatomy, bulge in the left groin c/w hernia, not able to reduce, mild tenderness, no rebound/guarding, no peritoneal signs, no overlying skin changes.   Results for orders placed or performed during the hospital encounter of 10/13/23 (from the past 48 hour(s))  Urinalysis, Routine w reflex microscopic -Urine, Clean Catch     Status: Abnormal   Collection Time: 10/13/23  5:36 PM  Result Value Ref Range   Color, Urine STRAW (A) YELLOW   APPearance CLEAR CLEAR   Specific Gravity, Urine 1.010 1.005 -  1.030   pH 7.0 5.0 - 8.0   Glucose, UA NEGATIVE NEGATIVE mg/dL   Hgb urine dipstick NEGATIVE NEGATIVE   Bilirubin Urine NEGATIVE NEGATIVE   Ketones, ur NEGATIVE NEGATIVE mg/dL   Protein, ur NEGATIVE NEGATIVE mg/dL   Nitrite NEGATIVE NEGATIVE   Leukocytes,Ua NEGATIVE NEGATIVE    Comment: Performed at [facility], [address], [place], [place] [address]  CBC with Differential     Status: None   Collection Time: 10/13/23  5:41 PM  Result Value Ref Range   WBC 8.4 4.0 - 10.5 K/uL   RBC 5.15 4.22 - 5.81 MIL/uL   Hemoglobin 15.2 13.0 - 17.0 g/dL   HCT 57.8 46.9 - 62.9 %   MCV 86.6 80.0 - 100.0 fL   MCH 29.5 26.0 - 34.0 pg   MCHC 34.1 30.0 - 36.0 g/dL   RDW 52.8 41.3 - 24.4 %   Platelets 298 150 - 400 K/uL   nRBC 0.0 0.0 - 0.2 %   Neutrophils Relative % 67 %   Neutro Abs 5.7 1.7 - 7.7 K/uL   Lymphocytes Relative 24 %   Lymphs Abs 2.0 0.7 - 4.0 K/uL   Monocytes Relative 7 %   Monocytes Absolute 0.6 0.1 - 1.0 K/uL   Eosinophils Relative 1 %   Eosinophils Absolute 0.1 0.0 - 0.5 K/uL   Basophils Relative 1 %   Basophils Absolute 0.0 0.0 - 0.1 K/uL  Immature Granulocytes 0 %   Abs Immature Granulocytes 0.01 0.00 - 0.07 K/uL    Comment: Performed at [facility], [address] [place], [place] [address]  Comprehensive metabolic panel     Status: Abnormal   Collection Time: 10/13/23  5:41 PM  Result Value Ref Range   Sodium 139 135 - 145 mmol/L   Potassium 4.0 3.5 - 5.1 mmol/L   Chloride 104 98 - 111 mmol/L   CO2 26 22 - 32 mmol/L   Glucose, Bld 101 (H) 70 - 99 mg/dL    Comment: Glucose reference range applies only to samples taken after fasting for at least 8 hours.   BUN 9 6 - 20 mg/dL   Creatinine, Ser 6.04 0.61 - 1.24 mg/dL   Calcium 9.8 8.9 - 54.0 mg/dL   Total Protein 7.3 6.5 - 8.1 g/dL   Albumin 4.4 3.5 - 5.0 g/dL   AST 18 15 - 41 U/L   ALT 17 0 - 44 U/L   Alkaline Phosphatase 53 38 - 126 U/L   Total Bilirubin 0.4 0.3 - 1.2 mg/dL   GFR, Estimated >98  >11 mL/min    Comment: (NOTE) Calculated using the CKD-EPI Creatinine Equation (2021)    Anion gap 9 5 - 15    Comment: Performed at [facility], [address], [place], [place] [address]  Lipase, blood     Status: None   Collection Time: 10/13/23  5:41 PM  Result Value Ref Range   Lipase 28 11 - 51 U/L    Comment: Performed at [facility], [address], [place], [place] [address]  I-Stat CG4 Lactic Acid     Status: None   Collection Time: 10/13/23 10:12 PM  Result Value Ref Range   Lactic Acid, Venous 0.9 0.5 - 1.9 mmol/L   No results found.  Assessment 27 y/o M presenting with an incarcerated, fat-containing left inguinal hernia   - Admit to [facility], med surg - Will plan for operative repair in the morning - NPO at [facility]  I reviewed last 24 h vitals and pain scores, last 24 h labs and trends, and last 24 h imaging results.  T[facility] 10/13/2023, 11:08 PM Please see Amion for pager number during day hours 7:00am-4:30pm or 7:00am -11:30am on weekends

## 2023-10-13 NOTE — ED Notes (Signed)
Patient transported to CT 

## 2023-10-13 NOTE — ED Notes (Signed)
ED TO INPATIENT HANDOFF REPORT  ED Nurse Name and Phone #: Raynelle Fanning, RN  S Name/Age/Gender Antonio Roberts 27 y.o. male Room/Bed: 009C/009C  Code Status   Code Status: Full Code  Home/SNF/Other Home Patient oriented to: self, place, time, and situation Is this baseline? Yes   Triage Complete: Triage complete  Chief Complaint Incarcerated inguinal hernia [K40.30]  Triage Note Pt sent down to the ED to have a possible inguinal hernia reduced, pt noticed it this past Thursday , does do some heavy lifting  at work    Allergies No Known Allergies  Level of Care/Admitting Diagnosis ED Disposition     ED Disposition  Admit   Condition  --   Comment  Hospital Area: [facility] [[id number]]  Level of Care: Med-Surg [16]  May place patient in observation at [facility] or [facility] if equivalent level of care is available:: No  Covid Evaluation: Asymptomatic - no recent exposure (last 10 days) testing not required  Diagnosis: Incarcerated inguinal hernia [161096]  Admitting Physician: [facility], MD [[id number]]  Attending Physician: [facility], MD [[id number]]          B Medical/Surgery History Past Medical History:  Diagnosis Date   Hx of fracture of arm    History reviewed. No pertinent surgical history.   A IV Location/Drains/Wounds Patient Lines/Drains/Airways Status     Active Line/Drains/Airways     Name Placement date Placement time Site Days   Peripheral IV 10/13/23 20 G 1" Right Antecubital 10/13/23  2107  Antecubital  less than 1            Intake/Output Last 24 hours No intake or output data in the 24 hours ending 10/13/23 2326  Labs/Imaging Results for orders placed or performed during the hospital encounter of 10/13/23 (from the past 48 hour(s))  Urinalysis, Routine w reflex microscopic -Urine, Clean Catch     Status: Abnormal   Collection Time: 10/13/23  5:36 PM  Result Value Ref Range   Color, Urine STRAW (A) YELLOW   APPearance CLEAR CLEAR    Specific Gravity, Urine 1.010 1.005 - 1.030   pH 7.0 5.0 - 8.0   Glucose, UA NEGATIVE NEGATIVE mg/dL   Hgb urine dipstick NEGATIVE NEGATIVE   Bilirubin Urine NEGATIVE NEGATIVE   Ketones, ur NEGATIVE NEGATIVE mg/dL   Protein, ur NEGATIVE NEGATIVE mg/dL   Nitrite NEGATIVE NEGATIVE   Leukocytes,Ua NEGATIVE NEGATIVE    Comment: Performed at [facility], [address], [place], [place] [address]  CBC with Differential     Status: None   Collection Time: 10/13/23  5:41 PM  Result Value Ref Range   WBC 8.4 4.0 - 10.5 K/uL   RBC 5.15 4.22 - 5.81 MIL/uL   Hemoglobin 15.2 13.0 - 17.0 g/dL   HCT 98.1 19.1 - 47.8 %   MCV 86.6 80.0 - 100.0 fL   MCH 29.5 26.0 - 34.0 pg   MCHC 34.1 30.0 - 36.0 g/dL   RDW 29.5 62.1 - 30.8 %   Platelets 298 150 - 400 K/uL   nRBC 0.0 0.0 - 0.2 %   Neutrophils Relative % 67 %   Neutro Abs 5.7 1.7 - 7.7 K/uL   Lymphocytes Relative 24 %   Lymphs Abs 2.0 0.7 - 4.0 K/uL   Monocytes Relative 7 %   Monocytes Absolute 0.6 0.1 - 1.0 K/uL   Eosinophils Relative 1 %   Eosinophils Absolute 0.1 0.0 - 0.5 K/uL   Basophils Relative 1 %  Basophils Absolute 0.0 0.0 - 0.1 K/uL   Immature Granulocytes 0 %   Abs Immature Granulocytes 0.01 0.00 - 0.07 K/uL    Comment: Performed at [facility], [address] [place], [place] [address]  Comprehensive metabolic panel     Status: Abnormal   Collection Time: 10/13/23  5:41 PM  Result Value Ref Range   Sodium 139 135 - 145 mmol/L   Potassium 4.0 3.5 - 5.1 mmol/L   Chloride 104 98 - 111 mmol/L   CO2 26 22 - 32 mmol/L   Glucose, Bld 101 (H) 70 - 99 mg/dL    Comment: Glucose reference range applies only to samples taken after fasting for at least 8 hours.   BUN 9 6 - 20 mg/dL   Creatinine, Ser 1.91 0.61 - 1.24 mg/dL   Calcium 9.8 8.9 - 47.8 mg/dL   Total Protein 7.3 6.5 - 8.1 g/dL   Albumin 4.4 3.5 - 5.0 g/dL   AST 18 15 - 41 U/L   ALT 17 0 - 44 U/L   Alkaline Phosphatase 53 38 - 126 U/L   Total Bilirubin  0.4 0.3 - 1.2 mg/dL   GFR, Estimated >29 >56 mL/min    Comment: (NOTE) Calculated using the CKD-EPI Creatinine Equation (2021)    Anion gap 9 5 - 15    Comment: Performed at [facility], [address] [place], [place] [address]  Lipase, blood     Status: None   Collection Time: 10/13/23  5:41 PM  Result Value Ref Range   Lipase 28 11 - 51 U/L    Comment: Performed at [facility], [address], [place], [place] [address]  I-Stat CG4 Lactic Acid     Status: None   Collection Time: 10/13/23 10:12 PM  Result Value Ref Range   Lactic Acid, Venous 0.9 0.5 - 1.9 mmol/L   No results found.  Pending Labs Unresulted Labs (From admission, onward)     Start     Ordered   10/14/23 0500  Basic metabolic panel  Tomorrow morning,   R        10/13/23 2307   10/14/23 0500  CBC  Tomorrow morning,   R        10/13/23 2307            Vitals/Pain Today's Vitals   10/13/23 2200 10/13/23 2230 10/13/23 2300 10/13/23 2318  BP: 123/73 124/80 124/80   Pulse: 68 68 78   Resp:      Temp:      TempSrc:      SpO2: 100% 100% 100%   PainSc:    9     Isolation Precautions No active isolations  Medications Medications  dextrose 5 % and 0.45 % NaCl with KCl 20 mEq/L infusion (has no administration in time range)  acetaminophen (TYLENOL) tablet 1,000 mg (1,000 mg Oral Given 10/13/23 2318)  ibuprofen (ADVIL) tablet 600 mg (has no administration in time range)  HYDROmorphone (DILAUDID) injection 1 mg (1 mg Intravenous Given 10/13/23 2319)  morphine (PF) 4 MG/ML injection 4 mg (4 mg Intravenous Given 10/13/23 2113)  ondansetron (ZOFRAN) injection 4 mg (4 mg Intravenous Given 10/13/23 2112)  iohexol (OMNIPAQUE) 350 MG/ML injection 75 mL (75 mLs Intravenous Contrast Given 10/13/23 2148)    Mobility walks     Focused Assessments Cardiac Assessment Handoff:    No results found for: "CKTOTAL", "CKMB", "CKMBINDEX", "TROPONINI" No results found for: "DDIMER" Does the Patient  currently have chest pain? No    R Recommendations: See Admitting Provider Note  Report given to:   Additional Notes: Pt is A&O x 4, he is NPO after midnight.

## 2023-10-14 ENCOUNTER — Encounter (HOSPITAL_COMMUNITY): Payer: Self-pay

## 2023-10-14 ENCOUNTER — Observation Stay (HOSPITAL_COMMUNITY): Payer: Worker's Compensation | Admitting: Anesthesiology

## 2023-10-14 ENCOUNTER — Other Ambulatory Visit: Payer: Self-pay

## 2023-10-14 ENCOUNTER — Encounter (HOSPITAL_COMMUNITY)
Admission: EM | Disposition: A | Discharge status: Home / Self Care | Payer: Self-pay | Source: Home / Self Care | Attending: Emergency Medicine

## 2023-10-14 ENCOUNTER — Other Ambulatory Visit (HOSPITAL_COMMUNITY): Payer: Self-pay

## 2023-10-14 DIAGNOSIS — K4090 Unilateral inguinal hernia, without obstruction or gangrene, not specified as recurrent: Secondary | ICD-10-CM | POA: Diagnosis not present

## 2023-10-14 DIAGNOSIS — K420 Umbilical hernia with obstruction, without gangrene: Secondary | ICD-10-CM | POA: Diagnosis not present

## 2023-10-14 DIAGNOSIS — K4030 Unilateral inguinal hernia, with obstruction, without gangrene, not specified as recurrent: Secondary | ICD-10-CM | POA: Diagnosis not present

## 2023-10-14 HISTORY — PX: UMBILICAL HERNIA REPAIR: SHX196

## 2023-10-14 HISTORY — PX: INSERTION OF MESH: SHX5868

## 2023-10-14 HISTORY — PX: INGUINAL HERNIA REPAIR: SHX194

## 2023-10-14 LAB — BASIC METABOLIC PANEL
Anion gap: 5 (ref 5–15)
BUN: 7 mg/dL (ref 6–20)
CO2: 28 mmol/L (ref 22–32)
Calcium: 9.4 mg/dL (ref 8.9–10.3)
Chloride: 104 mmol/L (ref 98–111)
Creatinine, Ser: 0.94 mg/dL (ref 0.61–1.24)
GFR, Estimated: >60 mL/min (ref >60)
Glucose, Bld: 104 mg/dL — ABNORMAL HIGH (ref 70–99)
Potassium: 3.9 mmol/L (ref 3.5–5.1)
Sodium: 137 mmol/L (ref 135–145)

## 2023-10-14 LAB — CBC
HCT: 42.2 % (ref 39.0–52.0)
Hemoglobin: 14.3 g/dL (ref 13.0–17.0)
MCH: 29.4 pg (ref 26.0–34.0)
MCHC: 33.9 g/dL (ref 30.0–36.0)
MCV: 86.7 fL (ref 80.0–100.0)
Platelets: 256 10*3/uL (ref 150–400)
RBC: 4.87 MIL/uL (ref 4.22–5.81)
RDW: 12.3 % (ref 11.5–15.5)
WBC: 5.5 10*3/uL (ref 4.0–10.5)
nRBC: 0 % (ref 0.0–0.2)

## 2023-10-14 LAB — SURGICAL PCR SCREEN
MRSA, PCR: NEGATIVE
Staphylococcus aureus: NEGATIVE

## 2023-10-14 SURGERY — REPAIR, HERNIA, INGUINAL, ADULT
Anesthesia: General | Site: Inguinal

## 2023-10-14 MED ORDER — IBUPROFEN 600 MG PO TABS
600.0000 mg | ORAL_TABLET | Freq: Four times a day (QID) | ORAL | 1 refills | Status: AC
  Filled 2023-10-14: qty 120, 30d supply, fill #0

## 2023-10-14 MED ORDER — BUPIVACAINE HCL (PF) 0.25 % IJ SOLN
INTRAMUSCULAR | Status: AC
Start: 2023-10-14 — End: ?
  Filled 2023-10-14: qty 30

## 2023-10-14 MED ORDER — FENTANYL CITRATE (PF) 250 MCG/5ML IJ SOLN
INTRAMUSCULAR | Status: AC
  Filled 2023-10-14: qty 5

## 2023-10-14 MED ORDER — ACETAMINOPHEN 500 MG PO TABS
1000.0000 mg | ORAL_TABLET | Freq: Three times a day (TID) | ORAL | Status: DC
  Administered 2023-10-14: 1000 mg via ORAL
  Filled 2023-10-14: qty 2

## 2023-10-14 MED ORDER — ONDANSETRON HCL 4 MG/2ML IJ SOLN
INTRAMUSCULAR | Status: DC | PRN
  Administered 2023-10-14: 4 mg via INTRAVENOUS

## 2023-10-14 MED ORDER — OXYCODONE HCL 5 MG PO TABS
5.0000 mg | ORAL_TABLET | ORAL | 0 refills | Status: AC | PRN
  Filled 2023-10-14: qty 15, 3d supply, fill #0

## 2023-10-14 MED ORDER — AMISULPRIDE (ANTIEMETIC) 5 MG/2ML IV SOLN: 10.0000 mg | Freq: Once | INTRAVENOUS | Status: DC | PRN

## 2023-10-14 MED ORDER — FENTANYL CITRATE (PF) 100 MCG/2ML IJ SOLN
25.0000 ug | INTRAMUSCULAR | Status: DC | PRN
  Administered 2023-10-14 (×2): 50 ug via INTRAVENOUS

## 2023-10-14 MED ORDER — MIDAZOLAM HCL 2 MG/2ML IJ SOLN
INTRAMUSCULAR | Status: AC
  Filled 2023-10-14: qty 2

## 2023-10-14 MED ORDER — SODIUM CHLORIDE 0.9 % IV SOLN: INTRAVENOUS | Status: DC | PRN

## 2023-10-14 MED ORDER — PROPOFOL 10 MG/ML IV BOLUS
INTRAVENOUS | Status: AC
  Filled 2023-10-14: qty 20

## 2023-10-14 MED ORDER — IBUPROFEN 600 MG PO TABS
600.0000 mg | ORAL_TABLET | Freq: Three times a day (TID) | ORAL | Status: DC
  Administered 2023-10-14: 600 mg via ORAL
  Filled 2023-10-14: qty 1

## 2023-10-14 MED ORDER — ORAL CARE MOUTH RINSE: 15.0000 mL | Freq: Once | OROMUCOSAL | Status: AC

## 2023-10-14 MED ORDER — OXYCODONE HCL 5 MG PO TABS
5.0000 mg | ORAL_TABLET | ORAL | Status: DC | PRN
  Administered 2023-10-14: 5 mg via ORAL
  Filled 2023-10-14: qty 1

## 2023-10-14 MED ORDER — CEFAZOLIN SODIUM 1 G IJ SOLR
INTRAMUSCULAR | Status: AC
Start: 2023-10-14 — End: ?
  Filled 2023-10-14: qty 20

## 2023-10-14 MED ORDER — CHLORHEXIDINE GLUCONATE 0.12 % MT SOLN
OROMUCOSAL | Status: AC
  Administered 2023-10-14: 15 mL via OROMUCOSAL
  Filled 2023-10-14: qty 15

## 2023-10-14 MED ORDER — LIDOCAINE 2% (20 MG/ML) 5 ML SYRINGE
INTRAMUSCULAR | Status: DC | PRN
  Administered 2023-10-14: 80 mg via INTRAVENOUS

## 2023-10-14 MED ORDER — FENTANYL CITRATE (PF) 100 MCG/2ML IJ SOLN
INTRAMUSCULAR | Status: AC
  Filled 2023-10-14: qty 2

## 2023-10-14 MED ORDER — 0.9 % SODIUM CHLORIDE (POUR BTL) OPTIME
TOPICAL | Status: DC | PRN
  Administered 2023-10-14: 1000 mL

## 2023-10-14 MED ORDER — BUPIVACAINE LIPOSOME 1.3 % IJ SUSP
INTRAMUSCULAR | Status: AC
  Filled 2023-10-14: qty 20

## 2023-10-14 MED ORDER — MIDAZOLAM HCL 2 MG/2ML IJ SOLN
INTRAMUSCULAR | Status: DC | PRN
  Administered 2023-10-14: 2 mg via INTRAVENOUS

## 2023-10-14 MED ORDER — METHOCARBAMOL 750 MG PO TABS
750.0000 mg | ORAL_TABLET | Freq: Four times a day (QID) | ORAL | 1 refills | Status: AC
  Filled 2023-10-14: qty 120, 30d supply, fill #0

## 2023-10-14 MED ORDER — ACETAMINOPHEN 500 MG PO TABS
1000.0000 mg | ORAL_TABLET | Freq: Four times a day (QID) | ORAL | 3 refills | Status: AC
  Filled 2023-10-14: qty 100, 13d supply, fill #0

## 2023-10-14 MED ORDER — METHOCARBAMOL 750 MG PO TABS: 750.0000 mg | ORAL_TABLET | Freq: Four times a day (QID) | ORAL | Status: DC | PRN

## 2023-10-14 MED ORDER — SUGAMMADEX SODIUM 200 MG/2ML IV SOLN
INTRAVENOUS | Status: DC | PRN
  Administered 2023-10-14: 200 mg via INTRAVENOUS

## 2023-10-14 MED ORDER — BUPIVACAINE LIPOSOME 1.3 % IJ SUSP
INTRAMUSCULAR | Status: DC | PRN
  Administered 2023-10-14: 40 mL

## 2023-10-14 MED ORDER — FENTANYL CITRATE (PF) 250 MCG/5ML IJ SOLN
INTRAMUSCULAR | Status: DC | PRN
  Administered 2023-10-14: 100 ug via INTRAVENOUS

## 2023-10-14 MED ORDER — DOCUSATE SODIUM 100 MG PO CAPS
100.0000 mg | ORAL_CAPSULE | Freq: Two times a day (BID) | ORAL | 2 refills | Status: AC
  Filled 2023-10-14: qty 60, 30d supply, fill #0

## 2023-10-14 MED ORDER — ONDANSETRON HCL 4 MG/2ML IJ SOLN
INTRAMUSCULAR | Status: AC
  Filled 2023-10-14: qty 2

## 2023-10-14 MED ORDER — CHLORHEXIDINE GLUCONATE 0.12 % MT SOLN: 15.0000 mL | Freq: Once | OROMUCOSAL | Status: AC

## 2023-10-14 MED ORDER — CEFAZOLIN SODIUM-DEXTROSE 2-3 GM-%(50ML) IV SOLR
INTRAVENOUS | Status: DC | PRN
  Administered 2023-10-14: 2 g via INTRAVENOUS

## 2023-10-14 MED ORDER — PROPOFOL 10 MG/ML IV BOLUS
INTRAVENOUS | Status: DC | PRN
  Administered 2023-10-14: 160 mg via INTRAVENOUS

## 2023-10-14 MED ORDER — PHENYLEPHRINE 80 MCG/ML (10ML) SYRINGE FOR IV PUSH (FOR BLOOD PRESSURE SUPPORT)
PREFILLED_SYRINGE | INTRAVENOUS | Status: AC
  Filled 2023-10-14: qty 10

## 2023-10-14 MED ORDER — DEXAMETHASONE SODIUM PHOSPHATE 10 MG/ML IJ SOLN
INTRAMUSCULAR | Status: DC | PRN
  Administered 2023-10-14: 10 mg via INTRAVENOUS

## 2023-10-14 MED ORDER — ROCURONIUM BROMIDE 10 MG/ML (PF) SYRINGE
PREFILLED_SYRINGE | INTRAVENOUS | Status: DC | PRN
  Administered 2023-10-14: 20 mg via INTRAVENOUS
  Administered 2023-10-14: 50 mg via INTRAVENOUS

## 2023-10-14 MED ORDER — DEXAMETHASONE SODIUM PHOSPHATE 10 MG/ML IJ SOLN
INTRAMUSCULAR | Status: AC
  Filled 2023-10-14: qty 1

## 2023-10-14 SURGICAL SUPPLY — 43 items
ADH SKN CLS APL DERMABOND .7 (GAUZE/BANDAGES/DRESSINGS) ×4
APL PRP STRL LF DISP 70% ISPRP (MISCELLANEOUS) ×2
BLADE CLIPPER SURG (BLADE) IMPLANT
CHLORAPREP W/TINT 26 (MISCELLANEOUS) ×2 IMPLANT
COVER SURGICAL LIGHT HANDLE (MISCELLANEOUS) ×2 IMPLANT
DERMABOND ADVANCED .7 DNX12 (GAUZE/BANDAGES/DRESSINGS) ×4 IMPLANT
DRAIN PENROSE .5X12 LATEX STL (DRAIN) ×2 IMPLANT
DRAPE LAPAROSCOPIC ABDOMINAL (DRAPES) IMPLANT
DRSG TEGADERM 4X4.75 (GAUZE/BANDAGES/DRESSINGS) IMPLANT
ELECT CAUTERY BLADE 6.4 (BLADE) IMPLANT
ELECT REM PT RETURN 9FT ADLT (ELECTROSURGICAL) ×2
ELECTRODE REM PT RTRN 9FT ADLT (ELECTROSURGICAL) ×2 IMPLANT
GAUZE 4X4 16PLY ~~LOC~~+RFID DBL (SPONGE) ×2 IMPLANT
GAUZE SPONGE 4X4 12PLY STRL (GAUZE/BANDAGES/DRESSINGS) IMPLANT
GLOVE BIO SURGEON STRL SZ 6.5 (GLOVE) ×2 IMPLANT
GLOVE BIOGEL PI IND STRL 6 (GLOVE) ×2 IMPLANT
GOWN STRL REUS W/ TWL LRG LVL3 (GOWN DISPOSABLE) ×6 IMPLANT
GOWN STRL REUS W/TWL LRG LVL3 (GOWN DISPOSABLE) ×6
KIT BASIN OR (CUSTOM PROCEDURE TRAY) ×4 IMPLANT
KIT TURNOVER KIT B (KITS) ×4 IMPLANT
MESH ULTRAPRO 3X6 7.6X15CM (Mesh General) IMPLANT
NDL HYPO 25GX1X1/2 BEV (NEEDLE) ×4 IMPLANT
NEEDLE HYPO 25GX1X1/2 BEV (NEEDLE) ×4 IMPLANT
NS IRRIG 1000ML POUR BTL (IV SOLUTION) ×4 IMPLANT
PACK GENERAL/GYN (CUSTOM PROCEDURE TRAY) ×2 IMPLANT
PAD ARMBOARD 7.5X6 YLW CONV (MISCELLANEOUS) ×4 IMPLANT
SUT MNCRL AB 4-0 PS2 18 (SUTURE) ×4 IMPLANT
SUT NOVA NAB DX-16 0-1 5-0 T12 (SUTURE) ×2 IMPLANT
SUT NOVA NAB GS-21 0 18 T12 DT (SUTURE) ×2 IMPLANT
SUT PDS AB 0 CT 36 (SUTURE) IMPLANT
SUT SILK 0 SH 30 (SUTURE) IMPLANT
SUT SILK 2 0 SH (SUTURE) IMPLANT
SUT SILK 3 0 (SUTURE)
SUT SILK 3-0 18XBRD TIE 12 (SUTURE) IMPLANT
SUT VIC AB 0 CT2 27 (SUTURE) ×2 IMPLANT
SUT VIC AB 2-0 SH 27 (SUTURE) ×6
SUT VIC AB 2-0 SH 27X BRD (SUTURE) ×2 IMPLANT
SUT VIC AB 3-0 SH 27 (SUTURE) ×6
SUT VIC AB 3-0 SH 27X BRD (SUTURE) ×2 IMPLANT
SUT VIC AB 3-0 SH 27XBRD (SUTURE) ×2 IMPLANT
SYR CONTROL 10ML LL (SYRINGE) ×4 IMPLANT
TOWEL GREEN STERILE (TOWEL DISPOSABLE) ×4 IMPLANT
TOWEL GREEN STERILE FF (TOWEL DISPOSABLE) ×4 IMPLANT

## 2023-10-14 NOTE — Plan of Care (Signed)

## 2023-10-14 NOTE — Transfer of Care (Signed)
Immediate Anesthesia Transfer of Care Note  Patient: Antonio Roberts  Procedure(s) Performed: HERNIA REPAIR LEFT INGUINAL (Left: Inguinal) HERNIA REPAIR UMBILICAL ADULT (Abdomen) INSERTION OF MESH (Left: Inguinal)  Patient Location: PACU  Anesthesia Type:General  Level of Consciousness: drowsy and patient cooperative  Airway & Oxygen Therapy: Patient Spontanous Breathing  Post-op Assessment: Report given to RN and Post -op Vital signs reviewed and stable  Post vital signs: Reviewed and stable  Last Vitals:  Vitals Value Taken Time  BP    Temp    Pulse 89 10/14/23 1143  Resp 10 10/14/23 1143  SpO2 96 % 10/14/23 1143  Vitals shown include unfiled device data.  Last Pain:  Vitals:   10/14/23 0840  TempSrc: Oral  PainSc: 4          Complications: No notable events documented.

## 2023-10-14 NOTE — Progress Notes (Signed)
General Surgery Follow Up Note  Subjective:    Overnight Issues:   Objective:  Vital signs for last 24 hours: Temp:  [97.9 F (36.6 C)-98.2 F (36.8 C)] 98.2 F (36.8 C) (10/29 0804) Pulse Rate:  [68-98] 71 (10/29 0804) Resp:  [17-20] 18 (10/29 0804) BP: (117-137)/(66-97) 117/66 (10/29 0804) SpO2:  [98 %-100 %] 98 % (10/29 0804)  Hemodynamic parameters for last 24 hours:    Intake/Output from previous day: No intake/output data recorded.  Intake/Output this shift: No intake/output data recorded.  Vent settings for last 24 hours:    Physical Exam:  Gen: comfortable, no distress Neuro: follows commands, alert, communicative HEENT: PERRL Neck: supple CV: RRR Pulm: unlabored breathing on RA Abd: soft, NT   , UH GU: urine clear and yellow, +spontaneous void, LIH Extr: wwp, no edema  Results for orders placed or performed during the hospital encounter of 10/13/23 (from the past 24 hour(s))  Urinalysis, Routine w reflex microscopic -Urine, Clean Catch     Status: Abnormal   Collection Time: 10/13/23  5:36 PM  Result Value Ref Range   Color, Urine STRAW (A) YELLOW   APPearance CLEAR CLEAR   Specific Gravity, Urine 1.010 1.005 - 1.030   pH 7.0 5.0 - 8.0   Glucose, UA NEGATIVE NEGATIVE mg/dL   Hgb urine dipstick NEGATIVE NEGATIVE   Bilirubin Urine NEGATIVE NEGATIVE   Ketones, ur NEGATIVE NEGATIVE mg/dL   Protein, ur NEGATIVE NEGATIVE mg/dL   Nitrite NEGATIVE NEGATIVE   Leukocytes,Ua NEGATIVE NEGATIVE  CBC with Differential     Status: None   Collection Time: 10/13/23  5:41 PM  Result Value Ref Range   WBC 8.4 4.0 - 10.5 K/uL   RBC 5.15 4.22 - 5.81 MIL/uL   Hemoglobin 15.2 13.0 - 17.0 g/dL   HCT 66.4 40.3 - 47.4 %   MCV 86.6 80.0 - 100.0 fL   MCH 29.5 26.0 - 34.0 pg   MCHC 34.1 30.0 - 36.0 g/dL   RDW 25.9 56.3 - 87.5 %   Platelets 298 150 - 400 K/uL   nRBC 0.0 0.0 - 0.2 %   Neutrophils Relative % 67 %   Neutro Abs 5.7 1.7 - 7.7 K/uL   Lymphocytes Relative  24 %   Lymphs Abs 2.0 0.7 - 4.0 K/uL   Monocytes Relative 7 %   Monocytes Absolute 0.6 0.1 - 1.0 K/uL   Eosinophils Relative 1 %   Eosinophils Absolute 0.1 0.0 - 0.5 K/uL   Basophils Relative 1 %   Basophils Absolute 0.0 0.0 - 0.1 K/uL   Immature Granulocytes 0 %   Abs Immature Granulocytes 0.01 0.00 - 0.07 K/uL  Comprehensive metabolic panel     Status: Abnormal   Collection Time: 10/13/23  5:41 PM  Result Value Ref Range   Sodium 139 135 - 145 mmol/L   Potassium 4.0 3.5 - 5.1 mmol/L   Chloride 104 98 - 111 mmol/L   CO2 26 22 - 32 mmol/L   Glucose, Bld 101 (H) 70 - 99 mg/dL   BUN 9 6 - 20 mg/dL   Creatinine, Ser 6.43 0.61 - 1.24 mg/dL   Calcium 9.8 8.9 - 32.9 mg/dL   Total Protein 7.3 6.5 - 8.1 g/dL   Albumin 4.4 3.5 - 5.0 g/dL   AST 18 15 - 41 U/L   ALT 17 0 - 44 U/L   Alkaline Phosphatase 53 38 - 126 U/L   Total Bilirubin 0.4 0.3 - 1.2 mg/dL  GFR, Estimated >60 >60 mL/min   Anion gap 9 5 - 15  Lipase, blood     Status: None   Collection Time: 10/13/23  5:41 PM  Result Value Ref Range   Lipase 28 11 - 51 U/L  I-Stat CG4 Lactic Acid     Status: None   Collection Time: 10/13/23 10:12 PM  Result Value Ref Range   Lactic Acid, Venous 0.9 0.5 - 1.9 mmol/L  Surgical PCR screen     Status: None   Collection Time: 10/14/23 12:15 AM   Specimen: Nasal Mucosa; Nasal Swab  Result Value Ref Range   MRSA, PCR NEGATIVE NEGATIVE   Staphylococcus aureus NEGATIVE NEGATIVE  Basic metabolic panel     Status: Abnormal   Collection Time: 10/14/23  7:16 AM  Result Value Ref Range   Sodium 137 135 - 145 mmol/L   Potassium 3.9 3.5 - 5.1 mmol/L   Chloride 104 98 - 111 mmol/L   CO2 28 22 - 32 mmol/L   Glucose, Bld 104 (H) 70 - 99 mg/dL   BUN 7 6 - 20 mg/dL   Creatinine, Ser 1.61 0.61 - 1.24 mg/dL   Calcium 9.4 8.9 - 09.6 mg/dL   GFR, Estimated >04 >54 mL/min   Anion gap 5 5 - 15  CBC     Status: None   Collection Time: 10/14/23  7:16 AM  Result Value Ref Range   WBC 5.5 4.0 - 10.5  K/uL   RBC 4.87 4.22 - 5.81 MIL/uL   Hemoglobin 14.3 13.0 - 17.0 g/dL   HCT 09.8 11.9 - 14.7 %   MCV 86.7 80.0 - 100.0 fL   MCH 29.4 26.0 - 34.0 pg   MCHC 33.9 30.0 - 36.0 g/dL   RDW 82.9 56.2 - 13.0 %   Platelets 256 150 - 400 K/uL   nRBC 0.0 0.0 - 0.2 %    Assessment & Plan:   Present on Admission:  Incarcerated inguinal hernia    LOS: 0 days   Additional comments:I reviewed the patient's new clinical lab test results.   and I reviewed the patients new imaging test results.    Incarcerated LIH, UH - plan oLIHR with mesh and primary UHR. Informed consent was obtained after detailed explanation of risks, including bleeding, infection, hematoma/seroma, decreased fertility, temporary or permanent neuropathy, hernia recurrence, and mesh infection requiring explantation. All questions answered to the patient's satisfaction. FEN - strict NPO DVT - SCDs, LMWH Dispo - med-surg, anticipate home post-op  Diamantina Monks, MD Trauma & General Surgery Please use [email or web address] to contact on call provider  10/14/2023  *Care during the described time interval was provided by me. I have reviewed this patient's available data, including medical history, events of note, physical examination and test results as part of my evaluation.

## 2023-10-14 NOTE — Anesthesia Preprocedure Evaluation (Signed)
Anesthesia Evaluation  Patient identified by MRN, date of birth, ID band Patient awake    Reviewed: Allergy & Precautions, NPO status , Patient's Chart, lab work & pertinent test results  Airway Mallampati: II  TM Distance: >3 FB Neck ROM: Full    Dental  (+) Dental Advisory Given   Pulmonary neg pulmonary ROS   breath sounds clear to auscultation       Cardiovascular negative cardio ROS  Rhythm:Regular Rate:Normal     Neuro/Psych negative neurological ROS     GI/Hepatic negative GI ROS, Neg liver ROS,,,  Endo/Other  negative endocrine ROS    Renal/GU negative Renal ROS     Musculoskeletal   Abdominal   Peds  Hematology negative hematology ROS (+)   Anesthesia Other Findings   Reproductive/Obstetrics                             Anesthesia Physical Anesthesia Plan  ASA: 1  Anesthesia Plan: General   Post-op Pain Management: Tylenol PO (pre-op)* and Toradol IV (intra-op)*   Induction: Intravenous  PONV Risk Score and Plan: 2 and 3 and Dexamethasone, Ondansetron, Midazolam and Treatment may vary due to age or medical condition  Airway Management Planned: LMA and Oral ETT  Additional Equipment:   Intra-op Plan:   Post-operative Plan: Extubation in OR  Informed Consent: I have reviewed the patients History and Physical, chart, labs and discussed the procedure including the risks, benefits and alternatives for the proposed anesthesia with the patient or authorized representative who has indicated his/her understanding and acceptance.     Dental advisory given  Plan Discussed with: CRNA  Anesthesia Plan Comments:        Anesthesia Quick Evaluation

## 2023-10-14 NOTE — Plan of Care (Signed)
  Problem: Education: Goal: Knowledge of General Education information will improve Description: Including pain rating scale, medication(s)/side effects and non-pharmacologic comfort measures 10/14/2023 1413 by Letta Moynahan, RN Outcome: Adequate for Discharge 10/14/2023 1213 by Letta Moynahan, RN Outcome: Progressing   Problem: Health Behavior/Discharge Planning: Goal: Ability to manage health-related needs will improve 10/14/2023 1413 by Letta Moynahan, RN Outcome: Adequate for Discharge 10/14/2023 1213 by Letta Moynahan, RN Outcome: Progressing   Problem: Clinical Measurements: Goal: Ability to maintain clinical measurements within normal limits will improve 10/14/2023 1413 by Letta Moynahan, RN Outcome: Adequate for Discharge 10/14/2023 1213 by Letta Moynahan, RN Outcome: Progressing Goal: Will remain free from infection 10/14/2023 1413 by Letta Moynahan, RN Outcome: Adequate for Discharge 10/14/2023 1213 by Letta Moynahan, RN Outcome: Progressing Goal: Diagnostic test results will improve 10/14/2023 1413 by Letta Moynahan, RN Outcome: Adequate for Discharge 10/14/2023 1213 by Letta Moynahan, RN Outcome: Progressing Goal: Respiratory complications will improve 10/14/2023 1413 by Letta Moynahan, RN Outcome: Adequate for Discharge 10/14/2023 1213 by Letta Moynahan, RN Outcome: Progressing Goal: Cardiovascular complication will be avoided 10/14/2023 1413 by Letta Moynahan, RN Outcome: Adequate for Discharge 10/14/2023 1213 by Letta Moynahan, RN Outcome: Progressing   Problem: Activity: Goal: Risk for activity intolerance will decrease 10/14/2023 1413 by Letta Moynahan, RN Outcome: Adequate for Discharge 10/14/2023 1213 by Letta Moynahan, RN Outcome: Progressing   Problem: Nutrition: Goal: Adequate nutrition will be maintained 10/14/2023 1413 by Letta Moynahan, RN Outcome: Adequate for Discharge 10/14/2023 1213 by Letta Moynahan, RN Outcome: Progressing    Problem: Coping: Goal: Level of anxiety will decrease 10/14/2023 1413 by Letta Moynahan, RN Outcome: Adequate for Discharge 10/14/2023 1213 by Letta Moynahan, RN Outcome: Progressing   Problem: Elimination: Goal: Will not experience complications related to bowel motility 10/14/2023 1413 by Letta Moynahan, RN Outcome: Adequate for Discharge 10/14/2023 1213 by Letta Moynahan, RN Outcome: Progressing Goal: Will not experience complications related to urinary retention 10/14/2023 1413 by Letta Moynahan, RN Outcome: Adequate for Discharge 10/14/2023 1213 by Letta Moynahan, RN Outcome: Progressing   Problem: Pain Management: Goal: General experience of comfort will improve 10/14/2023 1413 by Letta Moynahan, RN Outcome: Adequate for Discharge 10/14/2023 1213 by Letta Moynahan, RN Outcome: Progressing   Problem: Safety: Goal: Ability to remain free from injury will improve 10/14/2023 1413 by Letta Moynahan, RN Outcome: Adequate for Discharge 10/14/2023 1213 by Letta Moynahan, RN Outcome: Progressing   Problem: Skin Integrity: Goal: Risk for impaired skin integrity will decrease 10/14/2023 1413 by Letta Moynahan, RN Outcome: Adequate for Discharge 10/14/2023 1213 by Letta Moynahan, RN Outcome: Progressing

## 2023-10-14 NOTE — Anesthesia Postprocedure Evaluation (Signed)
Anesthesia Post Note  Patient: Antonio Roberts  Procedure(s) Performed: HERNIA REPAIR LEFT INGUINAL (Left: Inguinal) HERNIA REPAIR UMBILICAL ADULT (Abdomen) INSERTION OF MESH (Left: Inguinal)     Patient location during evaluation: PACU Anesthesia Type: General Level of consciousness: awake and alert Pain management: pain level controlled Vital Signs Assessment: post-procedure vital signs reviewed and stable Respiratory status: spontaneous breathing, nonlabored ventilation, respiratory function stable and patient connected to nasal cannula oxygen Cardiovascular status: blood pressure returned to baseline and stable Postop Assessment: no apparent nausea or vomiting Anesthetic complications: no  No notable events documented.  Last Vitals:  Vitals:   10/14/23 1215 10/14/23 1224  BP: 130/75 129/78  Pulse: 89 82  Resp: 13 16  Temp:  36.6 C  SpO2: 98% 98%    Last Pain:  Vitals:   10/14/23 1224  TempSrc:   PainSc: 2                  Kennieth Rad

## 2023-10-14 NOTE — Op Note (Addendum)
   Operative Note   Date: 10/14/2023  Procedure: open left inguinal hernia repair with mesh, open primary umbilical hernia repair  Pre-op diagnosis: incarcerated left inguinal hernia, incarcerated umbilical hernia Post-op diagnosis: incarcerated fat containing left indirect inguinal hernia, incarcerated fat containing umbilical hernia (<1cm)  Indication and clinical history: The patient is a 27 y.o. year old male with a left inguinal hernia and umbilical hernia.  Surgeon: Diamantina Monks, MD  Anesthesiologist: Glade Stanford, MD Anesthesia: General  Findings:  Specimen: hernia sac EBL: <5cc Drains/Implants: 3x6 ultrapro mesh  Disposition: PACU - hemodynamically stable.  Description of procedure: The patient was positioned supine on the operating room table. General anesthetic induction and intubation were uneventful. Foley catheter insertion was performed and was atraumatic. Time-out was performed verifying correct patient, procedure, laterality, and signature of informed consent. The abdomen and left groin were prepped and draped in the usual sterile fashion.  The procedure began with an umbilical incision deepened to the level of the fascia where a sub-centimeter fat containing hernia was identified. This was primarily repaired with a single novofil suture. Local anesthetic was infiltrated. The skin was re-approximated with monocryl suture.   Next, an incision was made approximately 2/3 distance from the ASIS to the pubic tubercle and deepened through Scarpa's fascia until the external oblique aponeurosis was reached. The external oblique aponeurosis was entered sharply and opened using Metzenbaum scissors. The hernia was reduced The spermatic cord was isolated and encircled with a Penrose drain. An indirect hernia was clearly identified and high ligation performed after reduction of hernia contents.  The hernia sac was sent to pathology as a specimen. A 3x6 ultrapro mesh was cut to fit  and sutured to the pubic tubercle with a zero vicryl suture and run along the shelving edge inferiorly and the conjoined tendon superiorly. A slit was made in the center of the mesh to accommodate the cord structures and a vicryl suture was used to create a snug fit around it. The ends of the mesh were overlapped beneath the external oblique aponeurosis. The external oblique was closed with a 2-0 vicryl suture and Scarpa's closed with a 3-0 vicryl. Local anesthetic was infiltrated into the surrounding tissues. The skin was closed with 4-0 monocryl suture and dermabond applied as dressing.   All sponge and instrument counts were correct at the conclusion of the procedure. The patient was awakened from anesthesia, extubated uneventfully, and transported to PACU - hemodynamically stable.. There were no complications.    Diamantina Monks, MD General and Trauma Surgery [facility]

## 2023-10-14 NOTE — Plan of Care (Signed)

## 2023-10-14 NOTE — Discharge Instructions (Signed)
[facility]  SURGERY: POST OP INSTRUCTIONS Always review your discharge instruction sheet given to you by the facility where your surgery was performed. IF YOU HAVE DISABILITY OR FAMILY LEAVE FORMS, YOU MUST BRING THEM TO THE OFFICE FOR PROCESSING.   DO NOT GIVE THEM TO YOUR DOCTOR.  PAIN CONTROL  Pain regimen: take over-the-counter tylenol (acetaminophen) 1000mg  every six hours, the prescription ibuprofen (600mg ) every six hours and the robaxin (methocarbamol) 750mg  every six hours. With all three of these, you should be taking something every two hours. Example: tylenol (acetaminophen) at 8am, ibuprofen at 10am, robaxin (methocarbamol) at 12pm, tylenol (acetaminophen) again at 2pm, ibuprofen again at 4pm, robaxin (methocarbamol) at 6pm. You also have a prescription for oxycodone, which should be taken if the tylenol (acetaminophen), ibuprofen, and robaxin (methocarbamol) are not enough to control your pain. You may take the oxycodone as frequently as every four hours as needed, but if you are taking the other medications as above, you should not need the oxycodone this frequently. You have also been given a prescription for colace (docusate) which is a stool softener. Please take this as prescribed because the oxycodone can cause constipation and the colace (docusate) will minimize or prevent constipation. Do not drive while taking or under the influence of the oxycodone as it is a narcotic medication. Use ice packs to help control pain. If you need a refill on your pain medication, please contact your pharmacy.  They will contact our office to request authorization. Prescriptions will not be filled after 5pm or on week-ends.  HOME MEDICATIONS Take your usually prescribed medications unless otherwise directed.  DIET You should follow a light diet the first few days after arrival home.  Be sure to include lots of fluids daily.   CONSTIPATION It is common to experience some  constipation after surgery and if you are taking pain medication.  Increasing fluid intake and taking a stool softener (such as Colace) will usually help or prevent this problem from occurring.  A mild laxative (Miralax, over-the counter) should be taken according to package instructions if there are no bowel movements after 48 hours. If still no bowel movement 24 hours after taking Miralax, you may try magnesium citrate, available over the counter at a local pharmacy.   WOUND/INCISION CARE Most patients will experience some swelling and bruising in the area of the incisions.  Ice packs will help.  Swelling and bruising can take several days to resolve.  May shower beginning 10/15/23.  Do not peel off or scrub skin glue. May allow warm soapy water to run over incision, then rinse and pat dry.  Do not soak in any water (tubs, hot tubs, pools, lakes, oceans) for one week.   ACTIVITIES You may resume regular (light) daily activities beginning the next day--such as daily self-care, walking, climbing stairs--gradually increasing activities as tolerated.  You may have sexual intercourse when it is comfortable.   No lifting greater than 5 pounds for six weeks.  You may drive when you are no longer taking narcotic pain medication, you can comfortably wear a seatbelt, and you can safely maneuver your car and apply brakes.  FOLLOW-UP You should see your doctor in the office for a follow-up appointment approximately 2-3 weeks after your surgery.  You should have been given your post-op/follow-up appointment when your surgery was scheduled.  If you did not receive a post-op/follow-up appointment, make sure that you call for this appointment within a day or two after you  arrive home to ensure a convenient appointment time.  WHEN TO CALL YOUR DOCTOR: Fever over 101.5 Inability to urinate Continued bleeding from incision. Increased pain, redness, or drainage from the incision. Increasing abdominal pain  The  clinic staff is available to answer your questions during regular business hours.  Please don't hesitate to call and ask to speak to one of the nurses for clinical concerns.  If you have a medical emergency, go to the nearest emergency room or call 911.  A surgeon from [facility] is always on call at the hospital. [address], [place], [place]  [address] ? [address] [address], [place], [place]   [address] [phone] ? [phone] ? FAX [phone] Web site: [email or web address]

## 2023-10-14 NOTE — Anesthesia Procedure Notes (Signed)
Procedure Name: Intubation Date/Time: 10/14/2023 10:03 AM  Performed by: Gus Puma, CRNAPre-anesthesia Checklist: Patient identified, Emergency Drugs available, Suction available and Patient being monitored Patient Re-evaluated:Patient Re-evaluated prior to induction Oxygen Delivery Method: Circle System Utilized Preoxygenation: Pre-oxygenation with 100% oxygen Induction Type: IV induction Ventilation: Mask ventilation without difficulty Laryngoscope Size: Mac and 4 Grade View: Grade I Tube type: Oral Tube size: 7.5 mm Number of attempts: 1 Airway Equipment and Method: Stylet Placement Confirmation: ETT inserted through vocal cords under direct vision, positive ETCO2 and breath sounds checked- equal and bilateral Secured at: 23 cm Tube secured with: Tape Dental Injury: Teeth and Oropharynx as per pre-operative assessment

## 2023-10-15 ENCOUNTER — Encounter (HOSPITAL_COMMUNITY): Payer: Self-pay | Admitting: Surgery

## 2023-10-15 LAB — SURGICAL PATHOLOGY

## 2023-10-22 NOTE — Discharge Summary (Addendum)
[facility] Discharge Summary   Patient ID: Antonio Roberts MRN: [id number] DOB/AGE: 1996/07/08 27 y.o.  Admit date: 10/13/2023 Discharge date: 10/14/2023  Admitting Diagnosis: Inguinal hernia, incarcerated  Discharge Diagnosis Patient Active Problem List   Diagnosis Date Noted   Incarcerated inguinal hernia 10/13/2023   Scapular dyskinesis 08/27/2023   Somatic dysfunction of spine, thoracic 08/27/2023    Consultants None   Imaging: No results found.  Procedures Dr. Gust Rung Lovick (10/14/23) - Procedure: open left inguinal hernia repair with mesh, open primary umbilical hernia repair   Hospital Course:  27y/o M who presented with left groin bulge.  Workup showed fat containing inguinal hernia.  Patient was admitted and underwent procedure listed above.  Tolerated procedure well and was transferred to the floor.  Diet was advanced as tolerated.  On POD#0, the patient was voiding well, tolerating diet, ambulating well, pain well controlled, vital signs stable, incisions c/d/i and felt stable for discharge home.  Patient will follow up in our office and knows to call with questions or concerns.    I, or one of my colleagues, have personally reviewed the patients medication history on the [place] controlled substance database.  I did not personally evaluate this patient on the day of discharge, therefore the below information was obtained entirely from chart review.  Allergies as of 10/14/2023   No Known Allergies      Medication List     TAKE these medications    Acetaminophen Extra Strength 500 MG Tabs Commonly known as: TYLENOL Take 2 tablets (1,000 mg total) by mouth 4 (four) times daily.   buPROPion 150 MG 24 hr tablet Commonly known as: WELLBUTRIN XL Take 150 mg by mouth daily.   docusate sodium 100 MG capsule Commonly known as: Colace Take 1 capsule (100 mg total) by mouth 2 (two) times daily.   ibuprofen 600 MG tablet Commonly known as:  ADVIL Take 1 tablet (600 mg total) by mouth 4 (four) times daily.   methocarbamol 750 MG tablet Commonly known as: Robaxin-750 Take 1 tablet (750 mg total) by mouth 4 (four) times daily.   oxyCODONE 5 MG immediate release tablet Commonly known as: Roxicodone Take 1 tablet (5 mg total) by mouth every 4 (four) hours as needed.   Vitamin D-3 125 MCG (5000 UT) Tabs Take 5,000 Units by mouth daily as needed (for vitamin deficiency).          Follow-up Information     Diamantina Monks, MD Follow up in 2 week(s).   Specialty: Surgery Why: For wound re-check Contact information: [address] [place] [address] [facility] [place] [address] [phone]                 Signed: Hosie Spangle, [facility] 10/22/2023, 12:16 PM

## 2023-10-29 DIAGNOSIS — F419 Anxiety disorder, unspecified: Secondary | ICD-10-CM | POA: Diagnosis not present

## 2023-11-07 DIAGNOSIS — F419 Anxiety disorder, unspecified: Secondary | ICD-10-CM | POA: Diagnosis not present

## 2024-03-17 DIAGNOSIS — F419 Anxiety disorder, unspecified: Secondary | ICD-10-CM | POA: Diagnosis not present

## 2024-03-31 DIAGNOSIS — F419 Anxiety disorder, unspecified: Secondary | ICD-10-CM | POA: Diagnosis not present

## 2024-04-02 ENCOUNTER — Other Ambulatory Visit (HOSPITAL_COMMUNITY): Payer: Self-pay

## 2024-05-17 DIAGNOSIS — F419 Anxiety disorder, unspecified: Secondary | ICD-10-CM | POA: Diagnosis not present

## 2024-07-09 DIAGNOSIS — F331 Major depressive disorder, recurrent, moderate: Secondary | ICD-10-CM | POA: Diagnosis not present

## 2024-07-09 DIAGNOSIS — G4700 Insomnia, unspecified: Secondary | ICD-10-CM | POA: Diagnosis not present

## 2024-07-09 DIAGNOSIS — F411 Generalized anxiety disorder: Secondary | ICD-10-CM | POA: Diagnosis not present

## 2024-07-12 DIAGNOSIS — F419 Anxiety disorder, unspecified: Secondary | ICD-10-CM | POA: Diagnosis not present
